# Patient Record
Sex: Male | Born: 1995 | Hispanic: Yes | Marital: Single | Smoking: Current some day smoker
Health system: Southern US, Community
[De-identification: ages and names within clinical notes are randomized; demographics above are authoritative.]

---
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
## 2017-12-27 IMAGING — CR DG CHEST 2V
2 series · 2 of 2 positions shown · non-contrast
Comparison: None.

CLINICAL DATA: Left chest pain.

EXAM:
CHEST  2 VIEW

[chest pa]
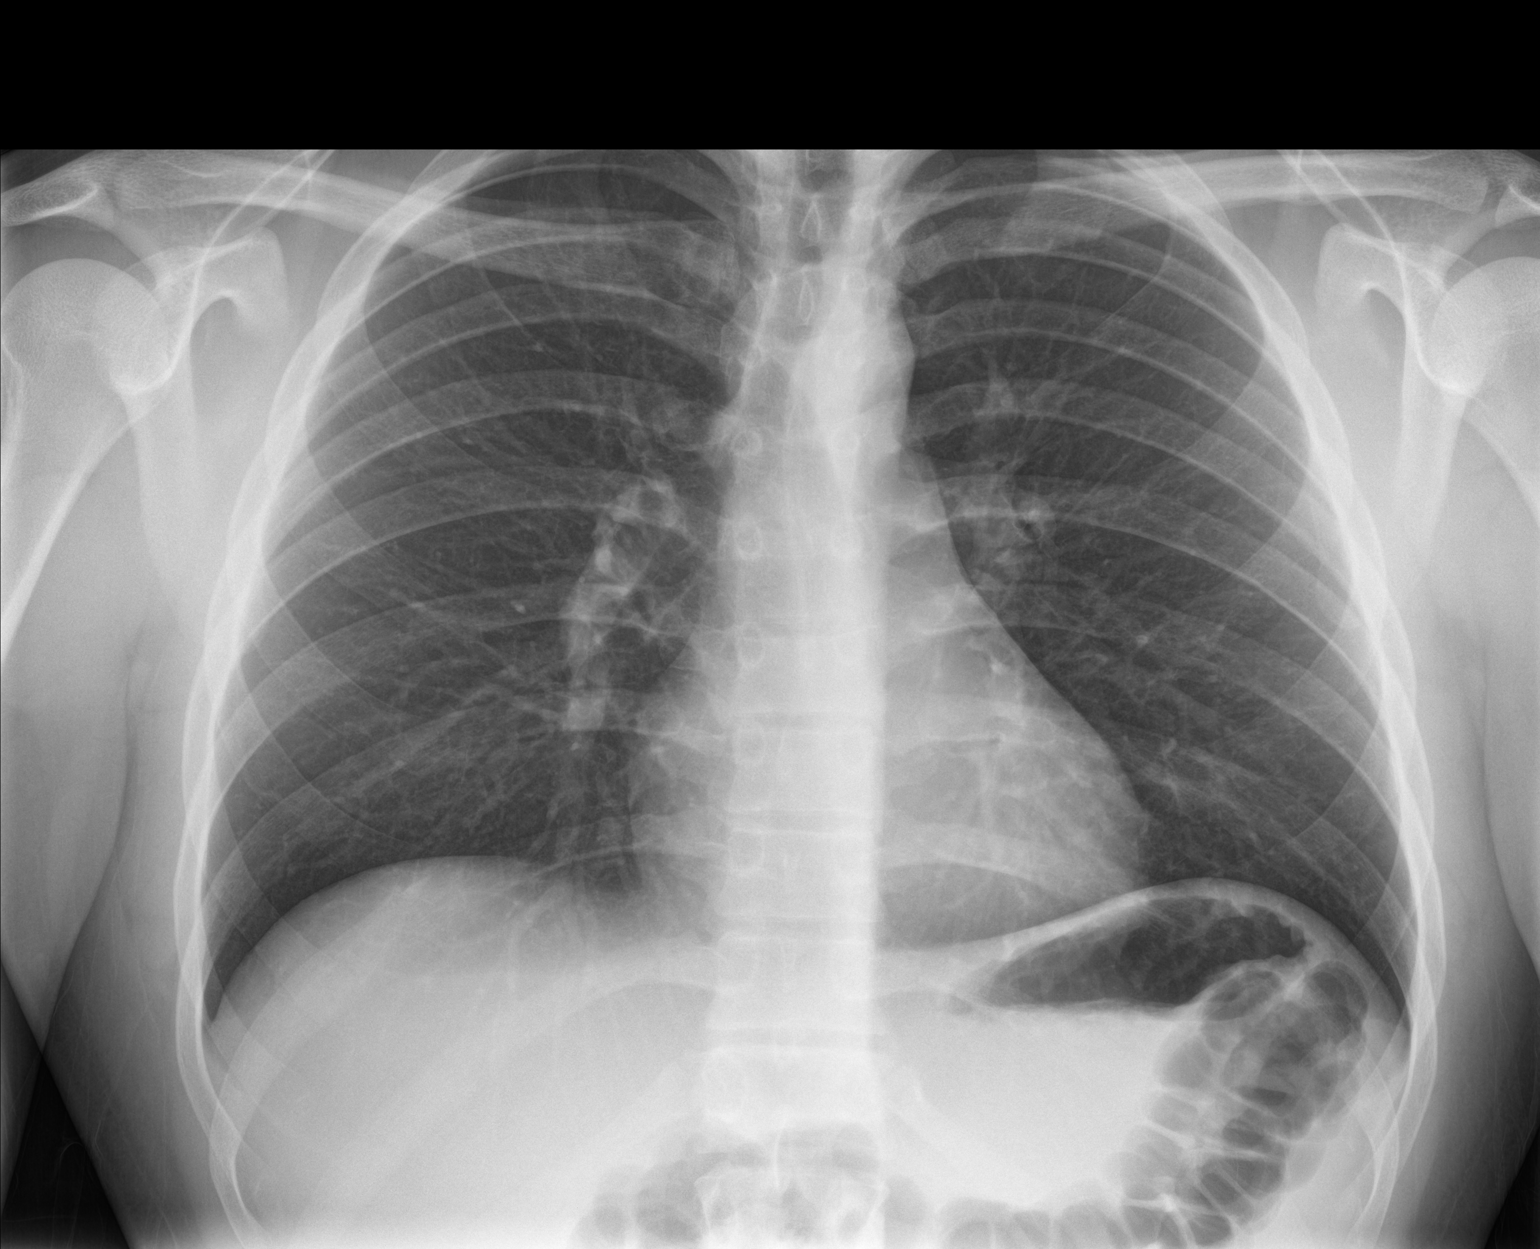

[chest lat]
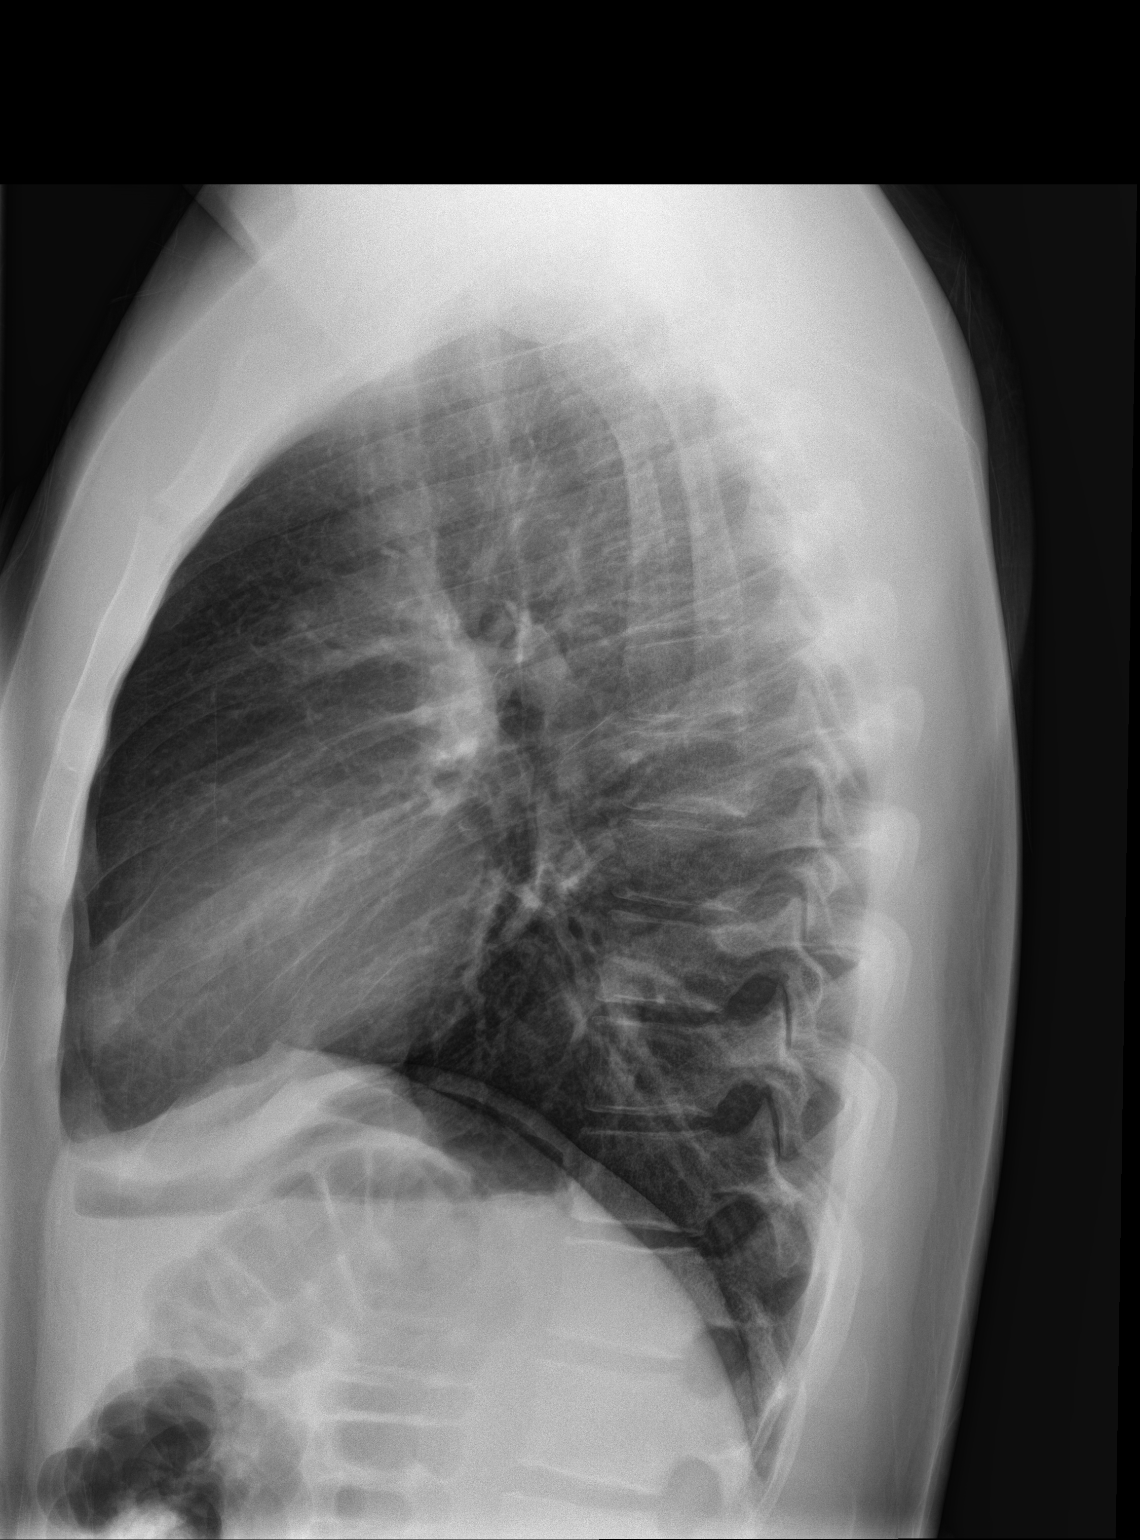

[2 of 2 positions shown; findings below may reference images not displayed]

FINDINGS: The heart size and mediastinal contours are within normal limits.
Both lungs are clear. The visualized skeletal structures are
unremarkable. No pleural effusions.
IMPRESSION: No active cardiopulmonary disease.

## 2018-05-01 IMAGING — CR DG CHEST 2V
1 series · 2 of 2 positions shown · non-contrast
Comparison: PA and lateral chest 07/25/2016..

CLINICAL DATA: Chest tightness and discomfort for 3 days.

EXAM:
CHEST  2 VIEW

[Series 1: dg chest 2 view · 0.14mm/px · 2 of 2 slices shown]
[im 1/2]
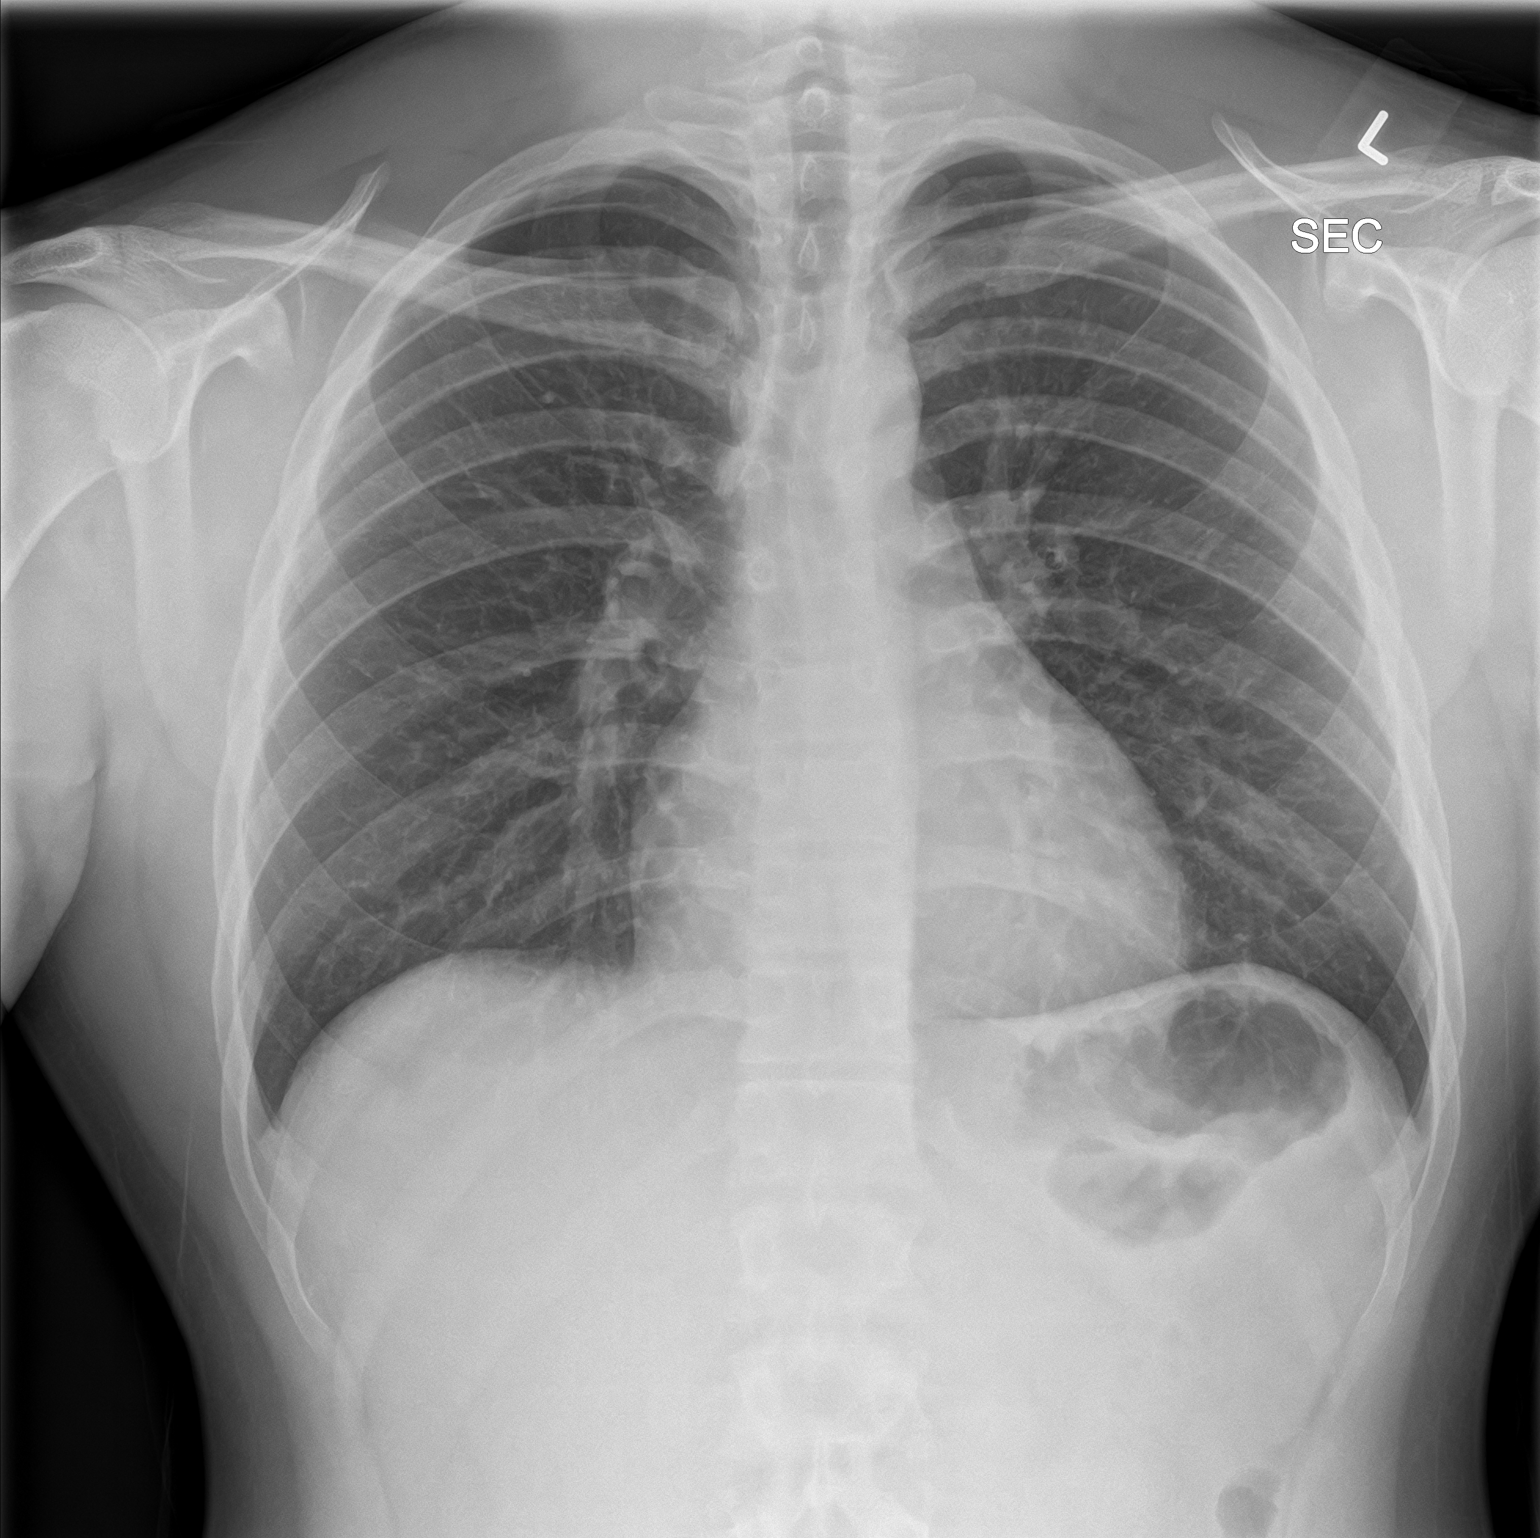
[im 2/2]
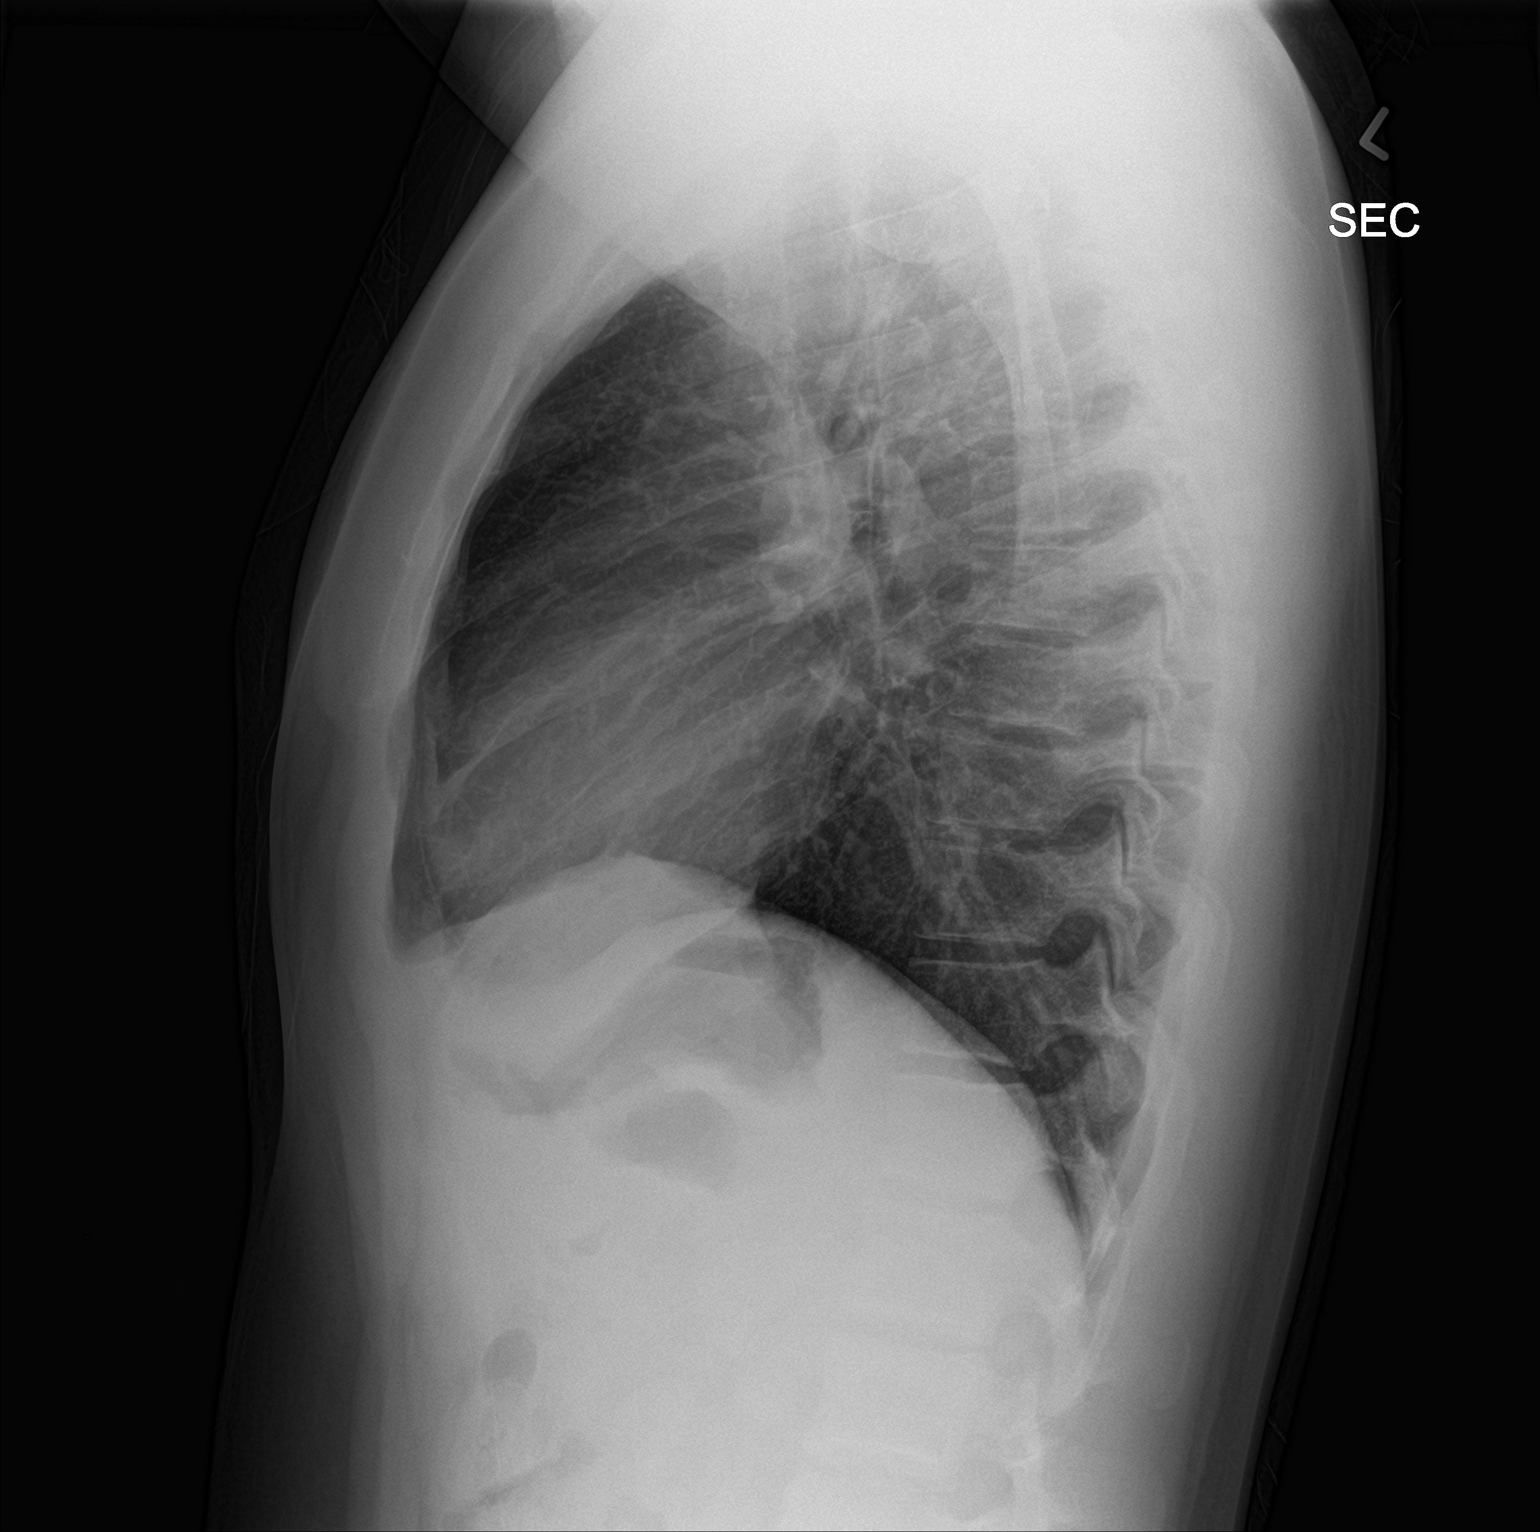

[2 of 2 positions shown; findings below may reference images not displayed]

FINDINGS: The lungs are clear. Heart size is normal. No pneumothorax or
pleural effusion. No bony abnormality.
IMPRESSION: Negative chest.

## 2019-06-05 ENCOUNTER — Ambulatory Visit: Payer: Self-pay

## 2019-09-04 ENCOUNTER — Other Ambulatory Visit: Payer: Self-pay

## 2019-09-04 ENCOUNTER — Ambulatory Visit: Payer: Self-pay | Admitting: Physician Assistant

## 2019-09-04 DIAGNOSIS — Z113 Encounter for screening for infections with a predominantly sexual mode of transmission: Secondary | ICD-10-CM

## 2019-09-04 LAB — GRAM STAIN

## 2019-09-06 ENCOUNTER — Encounter: Payer: Self-pay | Admitting: Physician Assistant

## 2019-09-06 NOTE — Progress Notes (Signed)
   [facility] Department STI clinic/screening visit  Subjective:  Spencer Garcia is a 24 y.o. male being seen today for an STI screening visit. The patient reports they do not have symptoms.    Patient has the following medical conditions:  There are no problems to display for this patient.    Chief Complaint  Patient presents with  . SEXUALLY TRANSMITTED DISEASE    screening    HPI  Patient reports that he does not have any symptoms but would like a screening today.  Denies chronic conditions, h/o surgery and regular medications.     See flowsheet for further details and programmatic requirements.    The following portions of the patient's history were reviewed and updated as appropriate: allergies, current medications, past medical history, past social history, past surgical history and problem list.  Objective:  There were no vitals filed for this visit.  Physical Exam Constitutional:      General: He is not in acute distress.    Appearance: Normal appearance.  HENT:     Head: Normocephalic and atraumatic.     Comments: No nits, lice, or hair loss. No cervical, supraclavicular or axillary adenopathy.    Mouth/Throat:     Mouth: Mucous membranes are moist.     Pharynx: Oropharynx is clear. No oropharyngeal exudate or posterior oropharyngeal erythema.  Eyes:     Conjunctiva/sclera: Conjunctivae normal.  Pulmonary:     Effort: Pulmonary effort is normal.  Abdominal:     Palpations: Abdomen is soft. There is no mass.     Tenderness: There is no abdominal tenderness. There is no guarding or rebound.  Genitourinary:    Penis: Normal.      Testes: Normal.     Comments: Pubic area without nits, lice, edema, erythema, lesions and inguinal adenopathy.  Patient with multiple ~1-2 mm flesh colored, smooth surfaced papules scattered on scrotum and at the base of the penis.   Penis uncircumcised and without rash, lesions and discharge at meatus. Musculoskeletal:      Cervical back: Neck supple. No tenderness.  Skin:    General: Skin is warm and dry.     Findings: No bruising, erythema, lesion or rash.  Neurological:     Mental Status: He is alert and oriented to person, place, and time.  Psychiatric:        Mood and Affect: Mood normal.        Behavior: Behavior normal.        Thought Content: Thought content normal.        Judgment: Judgment normal.       Assessment and Plan:  Spencer Garcia is a 24 y.o. male presenting to the [facility] for STI screening  1. Screening for STD (sexually transmitted disease) Patient into clinic without symptoms. Rec condoms with all sex. Reassured patient that likely papules are skin tags due to friction of clothing or shaving since they have smooth surface. Rec that patient follow up with PCP or Derm if has a change to any of the papules for further evaluation.  Await test results.  Counseled that RN will call if needs to RTC for treatment once results are back. - Gram stain - Gonococcus culture - HIV [place] [facility] - Syphilis Serology, [facility] - Gonococcus culture     No follow-ups on file.  No future appointments.  Matt Holmes, PA

## 2019-09-08 LAB — GONOCOCCUS CULTURE

## 2019-12-05 ENCOUNTER — Ambulatory Visit: Payer: BC Managed Care – PPO | Admitting: Physician Assistant

## 2019-12-05 ENCOUNTER — Encounter: Payer: Self-pay | Admitting: Physician Assistant

## 2019-12-05 ENCOUNTER — Other Ambulatory Visit: Payer: Self-pay

## 2019-12-05 DIAGNOSIS — Z202 Contact with and (suspected) exposure to infections with a predominantly sexual mode of transmission: Secondary | ICD-10-CM

## 2019-12-05 DIAGNOSIS — Z113 Encounter for screening for infections with a predominantly sexual mode of transmission: Secondary | ICD-10-CM

## 2019-12-05 LAB — GRAM STAIN

## 2019-12-05 MED ORDER — AZITHROMYCIN 500 MG PO TABS
1000.0000 mg | ORAL_TABLET | Freq: Once | ORAL | Status: AC
  Administered 2019-12-05: 1000 mg via ORAL

## 2019-12-05 NOTE — Progress Notes (Signed)
Gram stain reviewed and is negative today, so no treatment needed for gram stain per standing order. Awaiting further TR's. Pt treated as a contact to Chlamydia per provider and standing order with Azithromycin 1g po DOT. Provider orders completed.

## 2019-12-05 NOTE — Progress Notes (Signed)
   [facility] Department STI clinic/screening visit  Subjective:  Spencer Garcia is a 24 y.o. male being seen today for an STI screening visit. The patient reports they do not have symptoms.    Patient has the following medical conditions:  There are no problems to display for this patient.    Chief Complaint  Patient presents with  . SEXUALLY TRANSMITTED DISEASE    screening    HPI  Patient reports that he was notified by an anonymous text that he is a contact to Chlamydia.  Denies symptoms, chronic conditions and surgeries.  States last HIV test was 08/2019.  Last void prior to sample collection for Gram stain was about 1 hr ago.     See flowsheet for further details and programmatic requirements.    The following portions of the patient's history were reviewed and updated as appropriate: allergies, current medications, past medical history, past social history, past surgical history and problem list.  Objective:  There were no vitals filed for this visit.  Physical Exam Constitutional:      General: He is not in acute distress.    Appearance: Normal appearance.  HENT:     Head: Normocephalic and atraumatic.     Comments: No nits, lice or hair loss. No cervical, supraclavicular or axillary adenopathy.    Mouth/Throat:     Mouth: Mucous membranes are moist.     Pharynx: Oropharynx is clear. No oropharyngeal exudate or posterior oropharyngeal erythema.  Eyes:     Conjunctiva/sclera: Conjunctivae normal.  Pulmonary:     Effort: Pulmonary effort is normal.  Abdominal:     Palpations: Abdomen is soft. There is no mass.     Tenderness: There is no abdominal tenderness. There is no guarding or rebound.  Genitourinary:    Penis: Normal.      Testes: Normal.     Comments: Pubic area without nits, lice, edema, erythema, lesions and inguinal adenopathy. Penis uncircumcised, without rash, lesions and discharge at meatus. Musculoskeletal:     Cervical back: Neck  supple. No tenderness.  Skin:    General: Skin is warm and dry.     Findings: No bruising, erythema, lesion or rash.  Neurological:     Mental Status: He is alert and oriented to person, place, and time.  Psychiatric:        Mood and Affect: Mood normal.        Behavior: Behavior normal.        Thought Content: Thought content normal.        Judgment: Judgment normal.       Assessment and Plan:  Spencer Garcia is a 24 y.o. male presenting to the [facility] Department for STI screening  1. Screening for STD (sexually transmitted disease) Patient into clinic without symptoms. Rec condoms with all sex. Await test results.  Counseled that RN will call if needs to RTC for treatment once results are back. - Gram stain - Gonococcus culture - HIV [place] [facility] - Syphilis Serology, [place] [facility]  2. Chlamydia contact Will treat as a contact to Chlamydia with Azithromycin 1 g po DOT today. No sex for 7 days and until after partner completes treatment. RTC for re-treatment if vomits < 2 hr after taking medicine. - azithromycin (ZITHROMAX) tablet 1,000 mg     Return for 2-3 weeks for TR's, and PRN.  No future appointments.  Matt Holmes, PA

## 2019-12-09 LAB — GONOCOCCUS CULTURE

## 2019-12-30 ENCOUNTER — Ambulatory Visit: Payer: Self-pay | Admitting: Physician Assistant

## 2019-12-30 ENCOUNTER — Other Ambulatory Visit: Payer: Self-pay

## 2019-12-30 DIAGNOSIS — Z113 Encounter for screening for infections with a predominantly sexual mode of transmission: Secondary | ICD-10-CM

## 2019-12-30 LAB — GRAM STAIN

## 2019-12-30 NOTE — Progress Notes (Signed)
Gram stained review, negative today. Condoms given. No treatment needed per S.O. Providers orders completed.

## 2019-12-31 NOTE — Progress Notes (Signed)
° °  [facility] Department STI clinic/screening visit  Subjective:  Spencer Garcia is a 24 y.o. male being seen today for an STI screening visit. The patient reports they do not have symptoms.    Patient has the following medical conditions:  There are no problems to display for this patient.    Chief Complaint  Patient presents with   SEXUALLY TRANSMITTED DISEASE    screening    HPI  Patient reports that he is not having any symptoms but would like a screening today.  Reports that he last had a HIV test about 1 month ago and last void prior to sample collection for Gram stain was about 1 hr ago.   See flowsheet for further details and programmatic requirements.    The following portions of the patient's history were reviewed and updated as appropriate: allergies, current medications, past medical history, past social history, past surgical history and problem list.  Objective:  There were no vitals filed for this visit.  Physical Exam Constitutional:      General: He is not in acute distress.    Appearance: Normal appearance.  HENT:     Head: Normocephalic and atraumatic.     Comments: No nits, lice, or hair loss. No cervical, supraclavicular or axillary adenopathy.    Mouth/Throat:     Mouth: Mucous membranes are moist.     Pharynx: Oropharynx is clear. No oropharyngeal exudate or posterior oropharyngeal erythema.  Eyes:     Conjunctiva/sclera: Conjunctivae normal.  Pulmonary:     Effort: Pulmonary effort is normal.  Abdominal:     Palpations: Abdomen is soft. There is no mass.     Tenderness: There is no abdominal tenderness. There is no guarding or rebound.  Genitourinary:    Penis: Normal.      Testes: Normal.     Comments: Pubic area without nits, lice, edema, erythema, lesions and inguinal adenopathy. Penis uncircumcised, without rash, lesions and discharge at meatus. Musculoskeletal:     Cervical back: Neck supple. No tenderness.  Skin:     General: Skin is warm and dry.     Findings: No bruising, erythema, lesion or rash.  Neurological:     Mental Status: He is alert and oriented to person, place, and time.  Psychiatric:        Mood and Affect: Mood normal.        Behavior: Behavior normal.        Thought Content: Thought content normal.        Judgment: Judgment normal.       Assessment and Plan:  Spencer Garcia is a 24 y.o. male presenting to the [facility] Department for STI screening  1. Screening for STD (sexually transmitted disease) Patient into clinic without symptoms. Rec condoms with all sex. Await test results.  Counseled that RN will call if needs to RTC for treatment once results are back. - Gram stain - Gonococcus culture - HIV [place] [facility] - Syphilis Serology, [place] [facility] - Gonococcus culture     Return if symptoms worsen or fail to improve.  No future appointments.  Matt Holmes, PA

## 2020-01-04 LAB — GONOCOCCUS CULTURE

## 2021-02-23 ENCOUNTER — Ambulatory Visit: Payer: Self-pay | Admitting: Nurse Practitioner

## 2021-02-23 ENCOUNTER — Encounter: Payer: Self-pay | Admitting: Physician Assistant

## 2021-02-23 ENCOUNTER — Other Ambulatory Visit: Payer: Self-pay

## 2021-02-23 DIAGNOSIS — Z113 Encounter for screening for infections with a predominantly sexual mode of transmission: Secondary | ICD-10-CM

## 2021-02-23 DIAGNOSIS — B977 Papillomavirus as the cause of diseases classified elsewhere: Secondary | ICD-10-CM

## 2021-02-23 LAB — GRAM STAIN

## 2021-02-23 MED ORDER — IMIQUIMOD 5 % EX CREA: TOPICAL_CREAM | CUTANEOUS | 0 refills | Status: AC

## 2021-02-23 NOTE — Progress Notes (Signed)
Patient was seen for STD testing today. Condoms given. Gram stain reviewed by provider, no tx of gram stain per standing orders.

## 2021-02-23 NOTE — Progress Notes (Signed)
[facility] Department STI clinic/screening visit  Subjective:  Spencer Garcia is a 25 y.o. male being seen today for an STI screening visit. The patient reports they do not have symptoms.    Patient has the following medical conditions:  There are no problems to display for this patient.    Chief Complaint  Patient presents with   SEXUALLY TRANSMITTED DISEASE    screening    HPI  Patient reports to clinic desiring an STD screening.  Patient reports that he is asymptomatic.    Does the patient or their partner desires a pregnancy in the next year? No  Screening for MPX risk: Does the patient have an unexplained rash? No Is the patient MSM? No Does the patient endorse multiple sex partners or anonymous sex partners? No Did the patient have close or sexual contact with a person diagnosed with MPX? No Has the patient traveled outside the [place] where MPX is endemic? No Is there a high clinical suspicion for MPX-- evidenced by one of the following No  -Unlikely to be chickenpox  -Lymphadenopathy  -Rash that present in same phase of evolution on any given body part   See flowsheet for further details and programmatic requirements.    The following portions of the patient's history were reviewed and updated as appropriate: allergies, current medications, past medical history, past social history, past surgical history and problem list.  Objective:  There were no vitals filed for this visit.  Physical Exam Constitutional:      Appearance: Normal appearance.  HENT:     Head: Normocephalic and atraumatic.  Pulmonary:     Effort: Pulmonary effort is normal.  Genitourinary:    Penis: Normal.      Testes: Normal.     Comments: Pubic area without nits, lice, hair loss, edema, erythema,and inguinal adenopathy.  Small pinpoint size warts noted to at shaft of penis and around testicle area.  No discharge or rash noted.Testicles descended bilaterally,nt, no masses or  edema. Male is uncircumcised.  Musculoskeletal:     Cervical back: Normal range of motion and neck supple.  Lymphadenopathy:     Cervical: No cervical adenopathy.  Skin:    General: Skin is warm and dry.  Neurological:     Mental Status: He is alert and oriented to person, place, and time.  Psychiatric:        Mood and Affect: Mood normal.        Behavior: Behavior normal.      Assessment and Plan:  Spencer Garcia is a 25 y.o. male presenting to the [facility] Department for STI screening  1. Screening examination for venereal disease Patient does not have STI symptoms Patient accepted all screenings including GC and bloodwork for HIV/RPR.  Patient meets criteria for HepB screening? No. Ordered? No - no risk factors. Patient meets criteria for HepC screening? No. Ordered? No - no risk factors.  Recommended condom use with all sex Discussed importance of condom use for STI prevent  Treat gram stain per standing order Discussed time line for [facility] results and that patient will be called with positive results and encouraged patient to call if he had not heard in 2 weeks Recommended returning for continued or worsening symptoms.    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  Patient states noticed couple of months ago.    - imiquimod (ALDARA) 5 % cream; Apply topically 3 (three) times a week.  Dispense: 12 each; Refill: 0     No follow-ups on file.  No future appointments.  Glenna Fellows, NP

## 2021-02-23 NOTE — Progress Notes (Signed)
Patient seen by A.White, FNP.   Consulted by FNP regarding exam findings and treatment options.  Rx written for patient per his preference for cream for treatment. Aldara/ Imiquimod 5 % cream apply to affected area at hs 3 times weekly for up to 16 weeks with refills x 2 given to patient.

## 2021-02-24 ENCOUNTER — Encounter: Payer: Self-pay | Admitting: Nurse Practitioner

## 2021-02-27 LAB — GONOCOCCUS CULTURE

## 2022-06-06 DIAGNOSIS — R69 Illness, unspecified: Secondary | ICD-10-CM | POA: Diagnosis not present

## 2022-07-27 DIAGNOSIS — Z0000 Encounter for general adult medical examination without abnormal findings: Secondary | ICD-10-CM | POA: Diagnosis not present

## 2022-07-27 DIAGNOSIS — R030 Elevated blood-pressure reading, without diagnosis of hypertension: Secondary | ICD-10-CM | POA: Diagnosis not present

## 2023-08-15 DIAGNOSIS — Z130 Encounter for screening for diseases of the blood and blood-forming organs and certain disorders involving the immune mechanism: Secondary | ICD-10-CM | POA: Diagnosis not present

## 2023-08-15 DIAGNOSIS — Z1321 Encounter for screening for nutritional disorder: Secondary | ICD-10-CM | POA: Diagnosis not present

## 2023-08-15 DIAGNOSIS — Z13228 Encounter for screening for other metabolic disorders: Secondary | ICD-10-CM | POA: Diagnosis not present

## 2023-08-15 DIAGNOSIS — Z0000 Encounter for general adult medical examination without abnormal findings: Secondary | ICD-10-CM | POA: Diagnosis not present

## 2023-08-15 DIAGNOSIS — Z1331 Encounter for screening for depression: Secondary | ICD-10-CM | POA: Diagnosis not present

## 2023-08-15 DIAGNOSIS — Z113 Encounter for screening for infections with a predominantly sexual mode of transmission: Secondary | ICD-10-CM | POA: Diagnosis not present

## 2023-08-15 DIAGNOSIS — Z1329 Encounter for screening for other suspected endocrine disorder: Secondary | ICD-10-CM | POA: Diagnosis not present

## 2023-08-15 DIAGNOSIS — Z1330 Encounter for screening examination for mental health and behavioral disorders, unspecified: Secondary | ICD-10-CM | POA: Diagnosis not present
